# Patient Record
Sex: Male | Born: 1990 | Race: White | Hispanic: No | Marital: Single
Health system: Southern US, Community
[De-identification: ages and names within clinical notes are randomized; demographics above are authoritative.]

---

## 2014-08-19 ENCOUNTER — Other Ambulatory Visit (HOSPITAL_BASED_OUTPATIENT_CLINIC_OR_DEPARTMENT_OTHER): Payer: Self-pay | Admitting: *Deleted

## 2014-08-19 ENCOUNTER — Ambulatory Visit (HOSPITAL_BASED_OUTPATIENT_CLINIC_OR_DEPARTMENT_OTHER)
Admission: RE | Admit: 2014-08-19 | Discharge: 2014-08-19 | Disposition: A | Discharge status: Home / Self Care | Payer: Self-pay | Source: Ambulatory Visit | Attending: *Deleted | Admitting: *Deleted

## 2014-08-19 DIAGNOSIS — R059 Cough, unspecified: Secondary | ICD-10-CM

## 2014-08-19 DIAGNOSIS — R05 Cough: Secondary | ICD-10-CM | POA: Insufficient documentation

## 2014-08-19 DIAGNOSIS — J209 Acute bronchitis, unspecified: Secondary | ICD-10-CM

## 2014-08-19 DIAGNOSIS — R079 Chest pain, unspecified: Secondary | ICD-10-CM | POA: Insufficient documentation

## 2016-06-02 IMAGING — CR DG CHEST 2V
2 series · 2 of 2 positions shown · non-contrast
Comparison: None.

CLINICAL DATA: Chest pain, cough

EXAM:
CHEST  2 VIEW

[w chest pa]
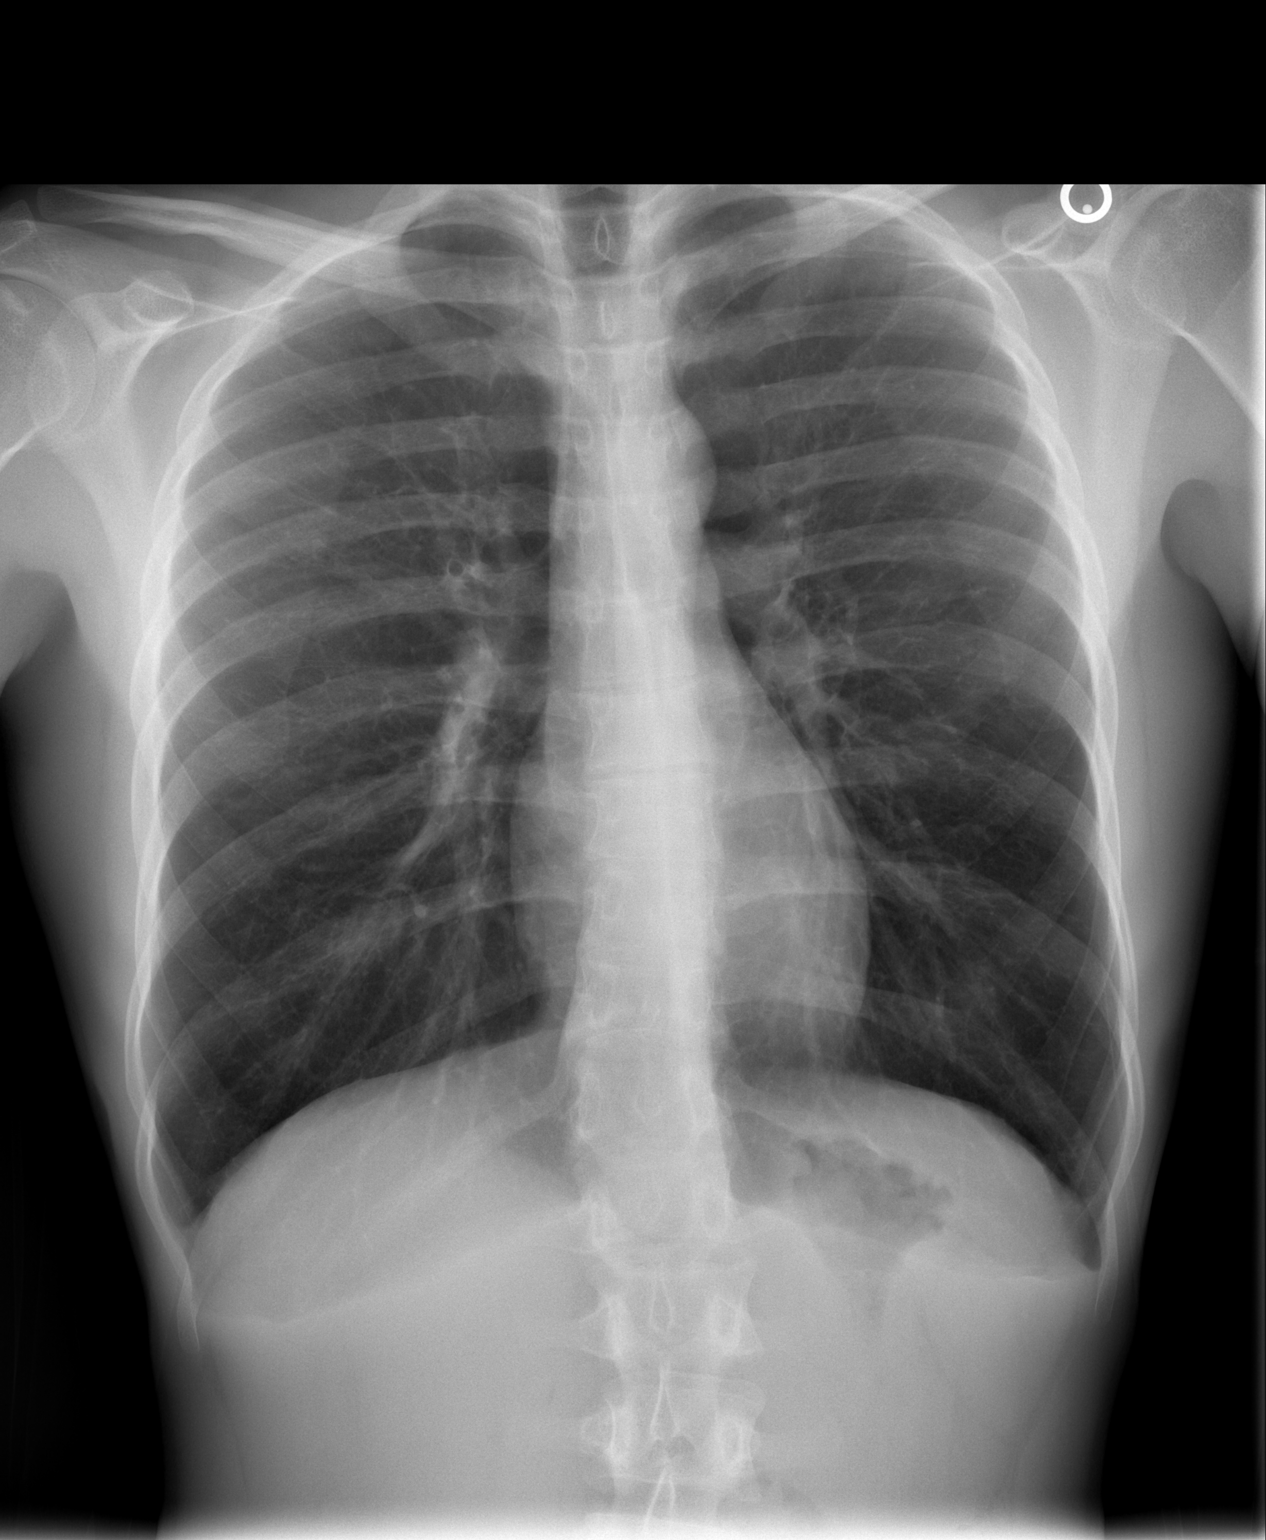

[w chest lat]
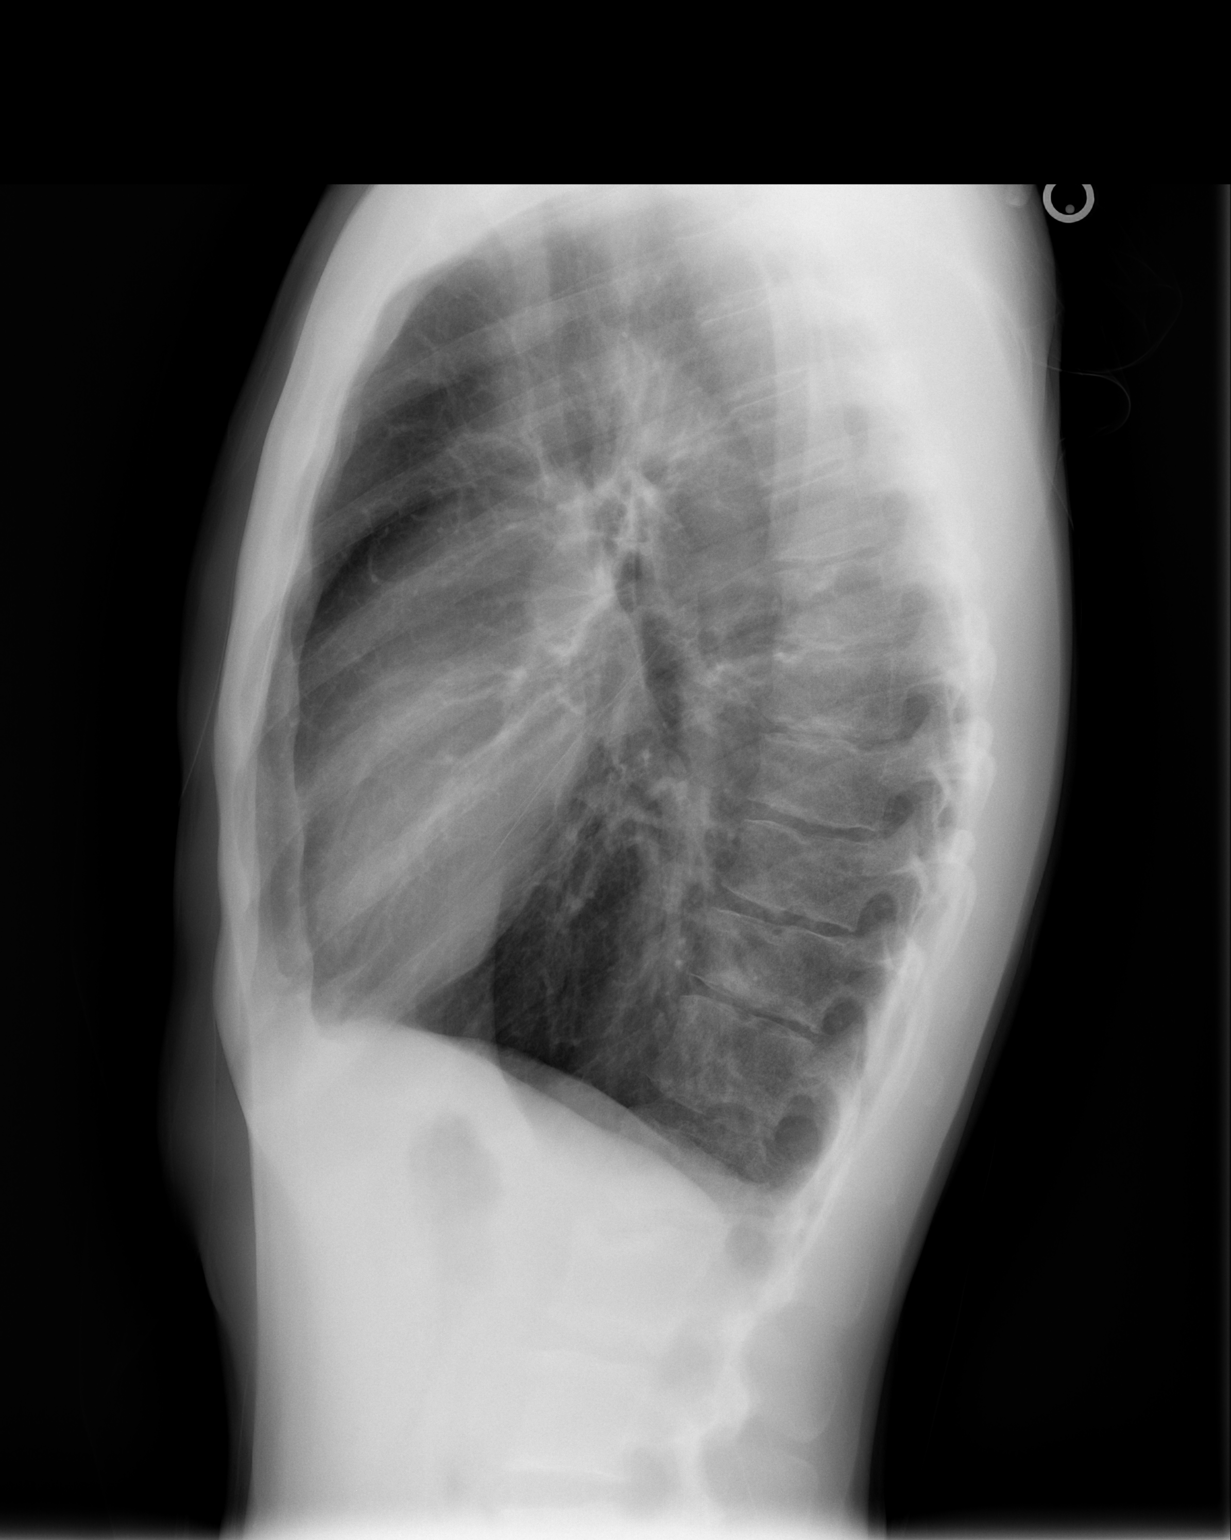

[2 of 2 positions shown; findings below may reference images not displayed]

FINDINGS: The lungs are clear but hyperaerated with somewhat increased AP
diameter. Does the patient have a history of asthma? No focal
infiltrate or effusion is seen. Mediastinal and hilar contours are
unremarkable, and the heart is within normal limits in size. No bony
abnormality is seen.
IMPRESSION: No active cardiopulmonary disease. The lungs are very hyperaerated
however, raising the question of asthma.
